# Patient Record
Sex: Female | Born: 1970 | Race: Black or African American | Hispanic: No | Marital: Single | Smoking: Never smoker
Health system: Southern US, Community
[De-identification: ages and names within clinical notes are randomized; demographics above are authoritative.]

## PROBLEM LIST (undated history)

## (undated) HISTORY — PX: OTHER SURGICAL HISTORY: SHX169

---

## 1998-02-28 ENCOUNTER — Emergency Department (HOSPITAL_COMMUNITY): Admission: EM | Admit: 1998-02-28 | Discharge: 1998-02-28 | Payer: Self-pay | Admitting: Emergency Medicine

## 1999-01-12 ENCOUNTER — Encounter: Payer: Self-pay | Admitting: Emergency Medicine

## 1999-01-12 ENCOUNTER — Emergency Department (HOSPITAL_COMMUNITY): Admission: EM | Admit: 1999-01-12 | Discharge: 1999-01-12 | Payer: Self-pay | Admitting: Emergency Medicine

## 2001-01-30 ENCOUNTER — Other Ambulatory Visit: Admission: RE | Admit: 2001-01-30 | Discharge: 2001-01-30 | Payer: Self-pay | Admitting: Obstetrics and Gynecology

## 2001-02-06 ENCOUNTER — Encounter: Payer: Self-pay | Admitting: Obstetrics and Gynecology

## 2001-02-06 ENCOUNTER — Ambulatory Visit (HOSPITAL_COMMUNITY): Admission: RE | Admit: 2001-02-06 | Discharge: 2001-02-06 | Payer: Self-pay | Admitting: Obstetrics and Gynecology

## 2001-09-10 ENCOUNTER — Other Ambulatory Visit: Admission: RE | Admit: 2001-09-10 | Discharge: 2001-09-10 | Payer: Self-pay | Admitting: Family Medicine

## 2002-07-13 ENCOUNTER — Encounter: Payer: Self-pay | Admitting: Obstetrics and Gynecology

## 2002-07-13 ENCOUNTER — Ambulatory Visit (HOSPITAL_COMMUNITY): Admission: RE | Admit: 2002-07-13 | Discharge: 2002-07-13 | Payer: Self-pay | Admitting: Obstetrics and Gynecology

## 2002-07-23 ENCOUNTER — Other Ambulatory Visit: Admission: RE | Admit: 2002-07-23 | Discharge: 2002-07-23 | Payer: Self-pay | Admitting: Obstetrics and Gynecology

## 2002-08-31 ENCOUNTER — Ambulatory Visit (HOSPITAL_COMMUNITY): Admission: RE | Admit: 2002-08-31 | Discharge: 2002-08-31 | Payer: Self-pay | Admitting: Obstetrics and Gynecology

## 2002-08-31 ENCOUNTER — Encounter: Payer: Self-pay | Admitting: Obstetrics and Gynecology

## 2002-09-20 ENCOUNTER — Inpatient Hospital Stay (HOSPITAL_COMMUNITY): Admission: AD | Admit: 2002-09-20 | Discharge: 2002-09-20 | Payer: Self-pay | Admitting: Obstetrics and Gynecology

## 2002-09-28 ENCOUNTER — Ambulatory Visit (HOSPITAL_COMMUNITY): Admission: RE | Admit: 2002-09-28 | Discharge: 2002-09-28 | Payer: Self-pay | Admitting: Obstetrics and Gynecology

## 2002-09-28 ENCOUNTER — Encounter: Payer: Self-pay | Admitting: Obstetrics and Gynecology

## 2002-10-02 ENCOUNTER — Inpatient Hospital Stay (HOSPITAL_COMMUNITY): Admission: AD | Admit: 2002-10-02 | Discharge: 2002-10-02 | Payer: Self-pay | Admitting: Obstetrics and Gynecology

## 2002-10-27 ENCOUNTER — Encounter: Payer: Self-pay | Admitting: Obstetrics and Gynecology

## 2002-10-27 ENCOUNTER — Inpatient Hospital Stay (HOSPITAL_COMMUNITY): Admission: AD | Admit: 2002-10-27 | Discharge: 2002-10-27 | Payer: Self-pay | Admitting: Obstetrics and Gynecology

## 2002-11-06 ENCOUNTER — Encounter: Payer: Self-pay | Admitting: Obstetrics and Gynecology

## 2002-11-06 ENCOUNTER — Inpatient Hospital Stay (HOSPITAL_COMMUNITY): Admission: AD | Admit: 2002-11-06 | Discharge: 2002-11-10 | Payer: Self-pay | Admitting: Obstetrics and Gynecology

## 2002-11-06 ENCOUNTER — Encounter (INDEPENDENT_AMBULATORY_CARE_PROVIDER_SITE_OTHER): Payer: Self-pay

## 2003-06-05 ENCOUNTER — Emergency Department (HOSPITAL_COMMUNITY): Admission: EM | Admit: 2003-06-05 | Discharge: 2003-06-06 | Payer: Self-pay

## 2003-12-16 ENCOUNTER — Emergency Department (HOSPITAL_COMMUNITY): Admission: EM | Admit: 2003-12-16 | Discharge: 2003-12-16 | Payer: Self-pay | Admitting: Family Medicine

## 2006-04-10 ENCOUNTER — Emergency Department (HOSPITAL_COMMUNITY): Admission: EM | Admit: 2006-04-10 | Discharge: 2006-04-10 | Payer: Self-pay | Admitting: Emergency Medicine

## 2006-09-21 ENCOUNTER — Inpatient Hospital Stay (HOSPITAL_COMMUNITY): Admission: AD | Admit: 2006-09-21 | Discharge: 2006-09-21 | Payer: Self-pay | Admitting: Obstetrics & Gynecology

## 2006-09-23 ENCOUNTER — Inpatient Hospital Stay (HOSPITAL_COMMUNITY): Admission: AD | Admit: 2006-09-23 | Discharge: 2006-09-23 | Payer: Self-pay | Admitting: Obstetrics and Gynecology

## 2008-01-25 ENCOUNTER — Inpatient Hospital Stay (HOSPITAL_COMMUNITY): Admission: AD | Admit: 2008-01-25 | Discharge: 2008-01-25 | Payer: Self-pay | Admitting: Obstetrics and Gynecology

## 2008-03-01 ENCOUNTER — Inpatient Hospital Stay (HOSPITAL_COMMUNITY): Admission: AD | Admit: 2008-03-01 | Discharge: 2008-03-01 | Payer: Self-pay | Admitting: Obstetrics and Gynecology

## 2008-05-13 ENCOUNTER — Observation Stay (HOSPITAL_COMMUNITY): Admission: AD | Admit: 2008-05-13 | Discharge: 2008-05-14 | Payer: Self-pay | Admitting: Obstetrics & Gynecology

## 2008-07-11 ENCOUNTER — Inpatient Hospital Stay (HOSPITAL_COMMUNITY): Admission: AD | Admit: 2008-07-11 | Discharge: 2008-07-11 | Payer: Self-pay | Admitting: Obstetrics and Gynecology

## 2008-07-15 ENCOUNTER — Inpatient Hospital Stay (HOSPITAL_COMMUNITY): Admission: AD | Admit: 2008-07-15 | Discharge: 2008-07-19 | Payer: Self-pay | Admitting: Obstetrics and Gynecology

## 2008-07-16 ENCOUNTER — Encounter (INDEPENDENT_AMBULATORY_CARE_PROVIDER_SITE_OTHER): Payer: Self-pay | Admitting: Obstetrics and Gynecology

## 2009-12-05 ENCOUNTER — Emergency Department (HOSPITAL_COMMUNITY)
Admission: EM | Admit: 2009-12-05 | Discharge: 2009-12-05 | Payer: Self-pay | Source: Home / Self Care | Admitting: Pediatric Emergency Medicine

## 2010-01-12 ENCOUNTER — Emergency Department (HOSPITAL_COMMUNITY): Admission: EM | Admit: 2010-01-12 | Discharge: 2010-01-12 | Payer: Self-pay | Admitting: Emergency Medicine

## 2010-05-02 ENCOUNTER — Emergency Department (HOSPITAL_COMMUNITY): Admission: EM | Admit: 2010-05-02 | Discharge: 2010-05-02 | Payer: Self-pay | Admitting: Family Medicine

## 2010-10-22 ENCOUNTER — Encounter: Payer: Self-pay | Admitting: Obstetrics and Gynecology

## 2010-12-15 LAB — POCT PREGNANCY, URINE: Preg Test, Ur: NEGATIVE

## 2010-12-25 LAB — URINALYSIS, ROUTINE W REFLEX MICROSCOPIC
Hgb urine dipstick: NEGATIVE
Ketones, ur: NEGATIVE mg/dL
Protein, ur: NEGATIVE mg/dL
Specific Gravity, Urine: 1.013 (ref 1.005–1.030)

## 2011-02-13 NOTE — Discharge Summary (Signed)
NAMEBROOKLYN, ALFREDO NO.:  [id number]   MEDICAL RECORD NO.:  [id number]          PATIENT TYPE:  INP   LOCATION:  9309                          FACILITY:  [facility]   PHYSICIAN:  Malachi Pro. Ambrose Mantle, M.D. DATE OF BIRTH:  1970/12/11   DATE OF ADMISSION:  07/15/2008  DATE OF DISCHARGE:  07/19/2008                               DISCHARGE SUMMARY   This is a 40 year old black female para 2-1-3-4, gravida 7 with EDC on  July 29, 2008,  admitted for repeat C-section in early labor.  Blood  group and type A positive, negative antibody sickle cell AS, RPR  nonreactive, rubella immune, hepatitis B surface antigen negative, HIV  negative, GC and chlamydia negative, quad screen negative.  This patient  had been scheduled for repeat C-section at 39 weeks; however, she went  into labor, came to the hospital, was observed, had cervical change, and  proceeded to the operating room for low transverse cervical C-section.  The patient underwent that procedure.  She declined to go ahead with a  tubal ligation.  Procedure was done under spinal anesthesia.  Blood loss  about 1000 mL.  Delivery of a female infant 8 pounds 9 ounces, Apgars of 9  at 1 and 9 at 5 minutes.  Postoperatively, the patient did well and was  discharged on the third postop day.  Her staples removed.  Strips were  applied.  She was ambulating well without difficulty, voiding well,  tolerating a regular diet.   Laboratory data showed initial hemoglobin of 12.1, hematocrit 36.1,  white count 10,400, platelet count 195,000.  RPR was nonreactive.  Followup hemoglobin 10, hematocrit 29.8.   FINAL DIAGNOSIS:  Intrauterine pregnancy 38 plus weeks delivered by  cesarean section in active labor after previous cesarean section and  declined  vaginal birth after cesarean.   OPERATION:  Low transverse cervical cesarean section.   FINAL CONDITION:  Improved.   INSTRUCTIONS:  Our regular discharge instruction booklet.   Percocet  5/325, #36 tablets 1 every 4-6 hours as needed for pain, Motrin 600 mg  #30 tablets 1 every 6 hours as needed for pain, and Micronor 1 by mouth  every day.  The patient is advised to return in 10-14 days for followup  examination.      Malachi Pro. Ambrose Mantle, M.D.  Electronically Signed     TFH/MEDQ  D:  07/19/2008  T:  07/19/2008  Job:  [id number]

## 2011-02-13 NOTE — H&P (Signed)
Meghan Pierce, Meghan Pierce NO.:  [id number]   MEDICAL RECORD NO.:  [id number]          PATIENT TYPE:  INP   LOCATION:  9199                          [facility]ACILITY:  [facility]   PHYSICIAN:  Malachi Pro. Ambrose Mantle, M.D. DATE OF BIRTH:  02/04/1971   DATE OF ADMISSION:  07/16/2008  DATE OF DISCHARGE:                              HISTORY & PHYSICAL   PRESENT ILLNESS:  This is a 40 year old black female para 2-1-3-4,  gravida 7 with prior C-section, who declines birth after cesarean, and  is admitted with regular contractions and cervical change.  This patient  began prenatal care late.  She was first seen in our office on March 16, 2008.  An ultrasound on March 04, 2008 showed an average gestational age  of [redacted] weeks with an [facility] of July 29, 2008.  The patient requested  repeat cesarean section and possible tubal ligation.  She had an  uncomplicated prenatal course.  Her blood group and type was A+,  negative antibody, sickle cell test was AS, RPR nonreactive, rubella  immune, hepatitis B surface antigen negative, HIV negative, GC and  chlamydia negative, quad screen negative. The patient was scheduled for  amniocentesis next week to confirm maturity before proceeding with  elective repeat C-section.  However, tonight she began contracting  regularly and painfully, and came to [facility] maternity admission  unit where she was found by the nurse to be 1 cm dilated and after 2-3  hours of observation she actually changed her cervix to 2-3 cm, although  it remains somewhat long and the presenting part is high.  I do feel  like the patient is in labor, and is therefore admitted for repeat C-  section.   No medication allergies.  She does state that she is sensitive to LATEX.   OPERATIONS:  C-section in 2004 for 33-5/7 twins who weighed 5 pounds 2  ounces and 4 pounds 11 ounces.   ILLNESSES:  None.   ALCOHOL, TOBACCO, AND DRUGS:  None.   FAMILY HISTORY:  Maternal grandfather with  high blood pressure, heart  disease, and diabetes.  Maternal grandmother with thyroid problems, and  her mother has thyroid problems.   OBSTETRICAL HISTORY:  In June of 1989, the patient delivered an 8 pound  5 ounce female infant at full term vaginally without complications.  In  November of 1992, she had a 9 pound, 11 ounce female vaginally. In 1999  and 2000, she had early abortions.  In February of 2004 she delivered  female twins 5 pounds 2 ounces and 4 pounds 11 ounces at 33-5/7 weeks  after preterm premature rupture of the membranes.  In 2009 she had a  spontaneous abortion.   PHYSICAL EXAMINATION ON ADMISSION:  Blood pressure is 109/64,  temperature 98.4, pulse 65, respirations 20.  HEAD, EYES, EARS, NOSE, AND THROAT:  Normal.  LUNGS:  Clear to auscultation.  HEART:  Normal size and sounds, no murmurs.  ABDOMEN:  Soft.  The last fundal height on September the 8th that we  have was 34 cm. However, it has significantly changed since then.  Fetal  heart tones are normal.  Contractions are 2-4 minutes apart.  The cervix  is 2-3 cm, probably 40-50% effaced, and vertex is at a -3 station.   ADMITTING IMPRESSION:  Intrauterine pregnancy at approximately 38+  weeks, prior C-section, early labor.  The patient requests repeat C-  section, and I do feel like she is in labor so we will proceed with the  C-section.  She has signed her tubal ligation papers, but she is  uncertain whether she wants me to do it so I will ask her again prior to  surgery.      Malachi Pro. Ambrose Mantle, M.D.  Electronically Signed     TFH/MEDQ  D:  07/16/2008  T:  07/16/2008  Job:  [id number]
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
## 2011-02-13 NOTE — Op Note (Signed)
NAMELINDEY, RENZULLI NO.:  [id number]   MEDICAL RECORD NO.:  [id number]           PATIENT TYPE:   LOCATION:                                 FACILITY:   PHYSICIAN:  Malachi Pro. Ambrose Mantle, M.D. DATE OF BIRTH:  [birth year]   DATE OF PROCEDURE:  07/16/2008  DATE OF DISCHARGE:                               OPERATIVE REPORT   PREOPERATIVE DIAGNOSES:  Intrauterine pregnancy at 38 plus weeks, prior  C-section, declined V back, active labor.   POSTOPERATIVE DIAGNOSES:  Intrauterine pregnancy at 38 plus weeks, prior  C-section, declined V back, active labor.   OPERATION:  Low-transverse cervical C-section.   OPERATOR:  Malachi Pro. Ambrose Mantle, M.D. spinal anesthesia.   The patient had signed sterilization papers, but during her preop  counseling, she elected against having her tubes tied.  She was brought  to the operating room, given a spinal anesthetic by Dr. Malen Gauze placed in  a left lateral tilt position.  Fetal heart tones were normal.  The  abdomen was prepped with Betadine solution.  The urethra was prepped.  The Foley catheter was inserted to straight drain, non latex.  The  abdomen was draped as a sterile field.  The prior incision was somewhat  low and theoretically could sort of fall in the crease of her  panniculus, but I elected to use it anyway, may be incision through the  old one carried in layers down through the skin, subcutaneous tissue,  and fascia.  The fascia was incised transversally and separated from the  rectus muscles superiorly and inferiorly.  The rectus muscle was well  adherent in the midline.  I created a midline and opened the peritoneal  cavity.  There were adhesions present.  The lower uterine segment was  exposed and an incision was then made into the lower uterine segment  transversally through the superficial layers of the myometrium.  I then  entered the amniotic sac with my finger, a large amount of fluid was  obtained, incision was enlarged  by pulling superiorly and inferiorly.  The vertex was then delivered into the incisional opening.  The head was  delivered completely, the nose and pharynx was suctioned with the bulb.  The remainder of the baby was delivered.  The cord was clamped.  The  infant was given to Dr. Fayrene Fearing L. Alison Murray, who was in attendance; who was  an 8 pounds 9 ounces female with Apgars of 9 and 9 at 1 and 5 minutes.  Placenta was removed intact.  The cervix was dilated with a long ring  forceps and the uterine incision was closed with two running sutures of  0-Vicryl, locking the first layer, nonlocking on the second layer.  There was a hematoma that developed to the left uterine angle and I  sutured this with a figure-of-eight suture and it stopped.  It took one  extra figure-of-eight suture to complete hemostasis.  Liberal irrigation  allowed removal of all debris from both gutters.  Incision was checked  again for hemostasis.  There was no bleeding.  The abdominal wall was  then closed  after inspecting the uterus, tubes, and ovaries, which were  normal.  The abdominal wall was closed with interrupted sutures of 0-  Vicryl through the rectus muscle and peritoneum, two running sutures of  0-Vicryl and  the fascia.  A running 3-0 Vicryl on the subcu tissue and staples on the  skin.  The patient seemed to tolerate the procedure well and blood loss  was thought to be about a 1000 mL.  Sponge and needle counts were  correct and she was returned to recovery in satisfactory condition.      Malachi Pro. Ambrose Mantle, M.D.  Electronically Signed     TFH/MEDQ  D:  07/16/2008  T:  07/16/2008  Job:  [id number]

## 2011-02-16 NOTE — Op Note (Signed)
NAME:  Meghan Pierce, Meghan Pierce                        ACCOUNT NO.:  [id number]   MEDICAL RECORD NO.:  [id number]                   PATIENT TYPE:  INP   LOCATION:  9146                                 [facility]ACILITY:  [facility]   PHYSICIAN:  Malachi Pro. Ambrose Mantle, M.D.              DATE OF BIRTH:  06-27-1971   DATE OF PROCEDURE:  11/06/2002  DATE OF DISCHARGE:                                 OPERATIVE REPORT   PREOPERATIVE DIAGNOSES:  1. Intrauterine pregnancy 33 weeks 5 days.  2. Twin gestation.  3. Baby A vertex, baby B transverse.  4. Active labor.  5. Rupture of membranes.   POSTOPERATIVE DIAGNOSES:  1. Intrauterine pregnancy at 33 weeks 5 days.  2. Twin gestation.  3. Baby A vertex, baby B transverse.  4. Active labor.  5. Rupture of membranes.   OPERATION:  Low transverse cervical cesarean section.   OPERATOR:  Malachi Pro. Ambrose Mantle, M.D.   ANESTHESIA:  Spinal.   PROCEDURE:  The patient was brought to the operating room and given a spinal  anesthetic by Quillian Quince, M.D.  She was placed in the left lateral  tilt position.  The abdomen was prepped with Betadine solution and draped as  a sterile field.  After anesthesia was confirmed a transverse incision was  made and carried in layers through the skin, subcutaneous tissue, and  fascia.  The fascia was separated from the rectus muscles superiorly and  inferiorly.  The rectus muscle was already widely separated in the midline.  The peritoneum was opened vertically.  The lower uterine segment was exposed  and incision was made in the lower uterine segment peritoneum and extended  laterally.  The bladder was pushed inferiorly.  Incision was then made into  the lower uterine segment and clear fluid was obtained.  Incision was  extended laterally and upward on both sides.  The vertex was elevated into  the operative field and the vertex was delivered without difficulty.  The  nose and pharynx were suctioned.  Remainder of the baby was delivered.   The  cord was clamped and the infant was given to Barnwell D. Imm, M.D. who was  in attendance.  At this point baby Bs membranes were intact, identified the  baby.  The vertex was in the maternal right upper abdomen.  The feet were in  the left side of the upper abdomen.  Found the left foot and then ruptured  the membranes.  Found the other foot and with fundal pressure on the vertex  pulled the feet down and then after the umbilicus delivered I delivered the  baby up to the chest.  Delivered the right arm first and then the left arm  and with the Mauriceau-Smellie-Deit maneuver delivered the vertex.  The nose  and pharynx were suctioned.  The infant was again given to [place] D. Imm,  M.D. who was in attendance.  He assigned baby  A Apgars of 7 at one and 9 at  five minutes.  It was a female weighing 5 pounds 2 ounces and baby B a  female 4 pounds 11 ounces, Apgars of 7 at one and 9 at five minutes.  The  placenta was removed.  I did not study it carefully to see if it was a  single placenta.  I inspected the inside of the uterus and found it to be  free of any placental fragments.  The cervix was already dilated.  A segment  of cord had been preserved from each baby in case a pH was necessary by  Adelene Amas. Imm, M.D. did not feel a pH was necessary.  I also obtained cord  blood from both babies for routine cord blood studies.  I closed the uterus  in two layers using a running locked suture of 0 Vicryl in the first layer,  nonlocking suture of the same material on the second layer.  I liberally  irrigated the uterine incision in both gutters, removed all clots and  debris, confirmed hemostasis, and checked both tubes and ovaries in the  uterus all of which appeared normal.  Neither the visceral nor the parietal  peritoneum were sutured.  The rectus muscle was closed with interrupted  sutures of 0 Vicryl.  The fascia with two running sutures of 0 Vicryl.  Subcutaneous with a running 3-0  Vicryl.  The skin was closed with automatic  staples.  The patient seemed to tolerate the procedure well.  Blood loss was  about 1000 mL.  Sponge and needle counts were correct.  She was returned to  recovery in satisfactory condition.                                               Malachi Pro. Ambrose Mantle, M.D.    TFH/MEDQ  D:  11/06/2002  T:  11/06/2002  Job:  [id number]

## 2011-02-16 NOTE — Discharge Summary (Signed)
NAME:  Meghan Pierce, Meghan Pierce                        ACCOUNT NO.:  [id number]   MEDICAL RECORD NO.:  [id number]                   PATIENT TYPE:  INP   LOCATION:  9146                                 [facility]ACILITY:  [facility]   PHYSICIAN:  Malachi Pro. Ambrose Mantle, M.D.              DATE OF BIRTH:  02/26/1971   DATE OF ADMISSION:  11/06/2002  DATE OF DISCHARGE:  11/10/2002                                 DISCHARGE SUMMARY   HISTORY OF PRESENT ILLNESS:  The patient is a 40 year old black single  female, para 3-0-2-3, gravida 6, with EDC 12/23/02 by ultrasound on 07/13/02  at 17-3/7 days, admitted with premature rupture of membranes and active  labor with baby A vertex, baby B transverse.   PRENATAL LABORATORY DATA:  Blood group and type A positive, negative  antibody sickle cell, hemoglobin AS, RPR nonreactive, rubella immune.  Hepatitis B surface antigen negative.  GC and Chlamydia negative.  Cystic  fibrosis negative.  Triple screen normal.  One hour Glucola 69.   HOSPITAL COURSE:  The patient's history and physical have been dictated.  In  summary, the patient was admitted with rupture of membranes.  Because of the  second baby being transverse, I elected to proceed with cesarean section.  She underwent a low transverse cervical cesarean section by Dr. Ambrose Mantle under  spinal anesthesia with delivery of baby A female, 5 pounds 2 ounces, Apgar's  of 7 at one at 9 at five minutes, baby B female, 4 pounds 11 ounces, Apgar's  of 7 at one and 9 at five minutes.  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  Postoperatively, the patient did  very well, and was discharged on the fourth postoperative day.  Staples were  out, strips applied.  Initial hemoglobin was 11.7, white count 9700,  platelet count 215,000.  Followup hemoglobin 11.6, hematocrit 34.6.  RPR was  non-reactive.  Ultrasound showed twin presentation, baby A was vertex, baby  B was transverse.   FINAL DIAGNOSES:  1.  Intrauterine pregnancy at 33-5/7 weeks.  2. Twin gestation.  3. Baby A vertex, baby B transverse.  4. Active labor.  5. Rupture of membranes.   OPERATION:  Low transverse cervical cesarean section.   SURGEON:  Malachi Pro. Ambrose Mantle, M.D.   CONDITION ON DISCHARGE:  Improved.   DISCHARGE INSTRUCTIONS:  Include our regular discharge instruction booklet.    DISCHARGE MEDICATIONS:  The patient is given a prescription for Percocet  5/325 mg #16 tablets one q.4-6h. p.r.n. pain.   FOLLOWUP:  The patient is advised to return in 10 to 14 days for followup  examination.                                               Malachi Pro.  Ambrose Mantle, M.D.    TFH/MEDQ  D:  11/10/2002  T:  11/10/2002  Job:  [id number]

## 2011-02-16 NOTE — H&P (Signed)
NAME:  Meghan Pierce, Meghan Pierce NO.:  [id number]   MEDICAL RECORD NO.:  [id number]                   PATIENT TYPE:   LOCATION:                                       FACILITY:   PHYSICIAN:  Malachi Pro. Ambrose Mantle, M.D.              DATE OF BIRTH:   DATE OF ADMISSION:  DATE OF DISCHARGE:                                HISTORY & PHYSICAL   HISTORY OF PRESENT ILLNESS:  The patient is a 40 year old black single  female, Para III, 0/2/3, Gravida VI with last menstrual period July 2003.  Estimated date of confinement December 22, 2002 by ultrasound on July 13, 2002 at 17 weeks and 1 day gestation. Admitted with premature rupture of the  membranes and in active labor with baby A vertex and baby B transverse.  Blood group and type A+ with a negative antibody. Sickle cell, hemoglobin  AS, RPR non-reactive. Rubella immune. Hepatitis B surface antigen negative.  GC and Chlamydia negative. Cystic fibrosis negative. Triple screen normal.  One hour Glucola 69. Prenatal course was complicated by twin gestation with  concordant growth. The patient had spontaneous rupture of membranes on the  morning of admission and came to Maternity Admissions Unit for evaluation.  Rupture of membranes was confirmed by Nitrazine and FERN testing. Ultrasound  showed twins, vertex, transverse. Contractions were every 2-3 minutes. The  cervix was 5 cm, 80%, vertex presentation. I asked for a Cesarean section  but a room was not available. A second team was called and we will proceed  with a Cesarean section when the room is available.   ALLERGIES:  No known drug allergies.   PAST SURGICAL HISTORY:  None.   PAST MEDICAL HISTORY:  Chlamydia and gonorrhea several years ago.   FAMILY HISTORY:  Mother with thyroid problem. Maternal grandfather with high  blood pressure, heart disease, and diabetes mellitus.   SOCIAL HISTORY:  No alcohol, tobacco, and drugs.   OBSTETRIC HISTORY:  In June of 1989,  an 8 pound and 5 ounce female was  delivered vaginally. November of 1992, a 9 pound and 11 ounce female delivered  vaginally. In 1999 and 2000 early abortions. In September of 2002 a 9 pound  4 ounce female delivered vaginally.   PHYSICAL EXAMINATION:  GENERAL: A well developed, well nourished black  female in some distress from the uterine contractions.  VITAL SIGNS: Temperature 98.1, blood pressure 31/62, pulse 83, respiratory  rate 18.  HEENT: No cranial abnormalities. Extraocular muscles intact. Nose and  pharynx clear.  NECK: Supple without thyromegaly.  HEART: Normal size and sounds. No murmurs.  LUNGS: Clear to auscultation and percussion.  BREAST: Not examined.  ABDOMEN: Soft. Fundal height was 41 cm on November 05, 2002. Fetal heart  tones normal for A&B. Cervix 5 cm, 80% vertex at a minus 1.    ADMISSION IMPRESSION:  Intrauterine pregnancy at 33 weeks and 5 days with  twin gestation, vertex, transverse. Active labor, rupture of membranes. The  patient is prepared for Cesarean section. We will proceed with the OR in  available.                                               Malachi Pro. Ambrose Mantle, M.D.    TFH/MEDQ  D:  11/06/2002  T:  11/06/2002  Job:  [id number]

## 2011-06-26 LAB — URINALYSIS, ROUTINE W REFLEX MICROSCOPIC
Bilirubin Urine: NEGATIVE
Hgb urine dipstick: NEGATIVE
Nitrite: NEGATIVE
Protein, ur: NEGATIVE

## 2011-06-26 LAB — CBC
HCT: 33.9 — ABNORMAL LOW
Hemoglobin: 11.8 — ABNORMAL LOW
MCHC: 34.7
MCV: 91.3
RBC: 3.72 — ABNORMAL LOW

## 2011-06-26 LAB — POCT PREGNANCY, URINE: Preg Test, Ur: POSITIVE

## 2011-07-02 LAB — CBC
HCT: 29.8 — ABNORMAL LOW
Hemoglobin: 10 — ABNORMAL LOW
MCV: 94.9
Platelets: 195
RBC: 3.8 — ABNORMAL LOW
RDW: 14
WBC: 10.4
WBC: 12.1 — ABNORMAL HIGH

## 2011-12-06 ENCOUNTER — Emergency Department (HOSPITAL_COMMUNITY)
Admission: EM | Admit: 2011-12-06 | Discharge: 2011-12-06 | Disposition: A | Discharge status: Home / Self Care | Payer: Self-pay | Attending: Emergency Medicine | Admitting: Emergency Medicine

## 2011-12-06 ENCOUNTER — Encounter (HOSPITAL_COMMUNITY): Payer: Self-pay

## 2011-12-06 DIAGNOSIS — L03119 Cellulitis of unspecified part of limb: Secondary | ICD-10-CM

## 2011-12-06 DIAGNOSIS — R21 Rash and other nonspecific skin eruption: Secondary | ICD-10-CM | POA: Insufficient documentation

## 2011-12-06 DIAGNOSIS — IMO0002 Reserved for concepts with insufficient information to code with codable children: Secondary | ICD-10-CM | POA: Insufficient documentation

## 2011-12-06 MED ORDER — HYDROCODONE-ACETAMINOPHEN 5-325 MG PO TABS: 1.0000 | ORAL_TABLET | ORAL | Status: AC | PRN

## 2011-12-06 MED ORDER — CEPHALEXIN 250 MG PO CAPS
500.0000 mg | ORAL_CAPSULE | Freq: Once | ORAL | Status: AC
  Administered 2011-12-06: 500 mg via ORAL
  Filled 2011-12-06: qty 2

## 2011-12-06 MED ORDER — CEPHALEXIN 500 MG PO CAPS: 500.0000 mg | ORAL_CAPSULE | Freq: Four times a day (QID) | ORAL | Status: AC

## 2011-12-06 NOTE — Discharge Instructions (Signed)
Cellulitis Cellulitis is an infection of the skin and the tissue beneath it. The area is typically red and tender. It is caused by germs (bacteria) (usually staph or strep) that enter the body through cuts or sores. Cellulitis most commonly occurs in the arms or lower legs.  HOME CARE INSTRUCTIONS   If you are given a prescription for medications which kill germs (antibiotics), take as directed until finished.   If the infection is on the arm or leg, keep the limb elevated as able.   Use a warm cloth several times per day to relieve pain and encourage healing.   See your caregiver for recheck of the infected site as directed if problems arise.   Only take over-the-counter or prescription medicines for pain, discomfort, or fever as directed by your caregiver.  SEEK MEDICAL CARE IF:   The area of redness (inflammation) is spreading, there are red streaks coming from the infected site, or if a part of the infection begins to turn dark in color.   The joint or bone underneath the infected skin becomes painful after the skin has healed.   The infection returns in the same or another area after it seems to have gone away.   A boil or bump swells up. This may be an abscess.   New, unexplained problems such as pain or fever develop.  SEEK IMMEDIATE MEDICAL CARE IF:   You have a fever.   You or your child feels drowsy or lethargic.   There is vomiting, diarrhea, or lasting discomfort or feeling ill (malaise) with muscle aches and pains.  MAKE SURE YOU:   Understand these instructions.   Will watch your condition.   Will get help right away if you are not doing well or get worse.  Document Released: 06/27/2005 Document Revised: 09/06/2011 Document Reviewed: 05/05/2008 [facility] Patient Information 2012 [place], [place].  Monitor the marked area on your arm.  If the redness and swelling continues to increase despite medication, return to the ED or urgent care center for reevaluation.

## 2011-12-06 NOTE — ED Provider Notes (Signed)
History     CSN: [id number]  Arrival date & time 12/06/11  1807   First MD Initiated Contact with Patient 12/06/11 1832      Chief Complaint  Patient presents with  . Joint Swelling    right arm swelling    (Consider location/radiation/quality/duration/timing/severity/associated sxs/prior treatment) Patient is a 41 y.o. female presenting with rash.  Rash  The current episode started 12 to 24 hours ago. The problem has been gradually worsening. The problem is associated with an unknown factor. There has been no fever. The rash is present on the right arm. The pain is moderate. The pain has been fluctuating since onset. Associated symptoms include itching and pain.    History reviewed. No pertinent past medical history.  History reviewed. No pertinent past surgical history.  History reviewed. No pertinent family history.  History  Substance Use Topics  . Smoking status: Never Smoker   . Smokeless tobacco: Not on file  . Alcohol Use: No    OB History    Grav Para Term Preterm Abortions TAB SAB Ect Mult Living                  Review of Systems  Skin: Positive for itching and rash.  All other systems reviewed and are negative.    Allergies  Review of patient's allergies indicates no known allergies.  Home Medications   Current Outpatient Rx  Name Route Sig Dispense Refill  . XANAX PO Oral Take 1 tablet by mouth at bedtime as needed. For sleep      BP 106/72  Pulse 80  Temp(Src) 98.1 F (36.7 C) (Oral)  Resp 16  SpO2 98%  LMP 11/25/2011  Physical Exam  Nursing note and vitals reviewed. Constitutional: She is oriented to person, place, and time. She appears well-developed and well-nourished.  HENT:  Head: Normocephalic.  Eyes: Conjunctivae are normal. Pupils are equal, round, and reactive to light.  Neck: Normal range of motion. Neck supple.  Cardiovascular: Normal rate, regular rhythm, normal heart sounds and intact distal pulses.   Pulmonary/Chest:  Effort normal and breath sounds normal.  Abdominal: Soft. Bowel sounds are normal.  Musculoskeletal: Normal range of motion.  Neurological: She is alert and oriented to person, place, and time.  Skin: Skin is warm and dry. There is erythema.     Psychiatric: She has a normal mood and affect. Her behavior is normal. Judgment and thought content normal.    ED Course  Procedures (including critical care time)  Labs Reviewed - No data to display No results found.   1. Cellulitis of upper arm      Area of cellulitis marked.  Patient started on Keflex.  Patient to return if symptoms worsen or do not improve despite treatment over the next 24-48 hours. MDM          Jimmye Norman, NP 12/06/11 1853  Jimmye Norman, NP 12/06/11 Windell Moment

## 2011-12-06 NOTE — ED Notes (Signed)
Pt states that she woke up with right upper arm redness, swelling, and pain this morning. She is unsure if anything bit her. She states that the pain is throbbing in nature. She took otc tylenol with no relief.

## 2011-12-07 NOTE — ED Provider Notes (Signed)
Medical screening examination/treatment/procedure(s) were performed by non-physician practitioner and as supervising physician I was immediately available for consultation/collaboration.  Toy Baker, MD 12/07/11 [phone]

## 2012-02-15 ENCOUNTER — Encounter (HOSPITAL_COMMUNITY): Payer: Self-pay

## 2012-02-15 ENCOUNTER — Emergency Department (HOSPITAL_COMMUNITY)
Admission: EM | Admit: 2012-02-15 | Discharge: 2012-02-15 | Disposition: A | Discharge status: Home / Self Care | Payer: Self-pay | Attending: Emergency Medicine | Admitting: Emergency Medicine

## 2012-02-15 DIAGNOSIS — M7989 Other specified soft tissue disorders: Secondary | ICD-10-CM | POA: Insufficient documentation

## 2012-02-15 DIAGNOSIS — M79609 Pain in unspecified limb: Secondary | ICD-10-CM | POA: Insufficient documentation

## 2012-02-15 DIAGNOSIS — L0390 Cellulitis, unspecified: Secondary | ICD-10-CM

## 2012-02-15 DIAGNOSIS — L03039 Cellulitis of unspecified toe: Secondary | ICD-10-CM | POA: Insufficient documentation

## 2012-02-15 DIAGNOSIS — L02619 Cutaneous abscess of unspecified foot: Secondary | ICD-10-CM | POA: Insufficient documentation

## 2012-02-15 MED ORDER — HYDROCODONE-ACETAMINOPHEN 5-325 MG PO TABS
1.0000 | ORAL_TABLET | Freq: Once | ORAL | Status: AC
  Administered 2012-02-15: 1 via ORAL
  Filled 2012-02-15: qty 1

## 2012-02-15 MED ORDER — CLINDAMYCIN HCL 150 MG PO CAPS: 450.0000 mg | ORAL_CAPSULE | Freq: Three times a day (TID) | ORAL | Status: AC

## 2012-02-15 NOTE — Discharge Instructions (Signed)
Cellulitis Cellulitis is an infection of the tissue under the skin. The infected area is usually red and tender. This is caused by germs. These germs enter the body through cuts or sores. This usually happens in the arms or lower legs. HOME CARE   Take your medicine as told. Finish it even if you start to feel better.   If the infection is on the arm or leg, keep it raised (elevated).   Use a warm cloth on the infected area several times a day.   See your doctor for a follow-up visit as told.  GET HELP RIGHT AWAY IF:   You are tired or confused.   You throw up (vomit).   You have watery poop (diarrhea).   You feel ill and have muscle aches.   You have a fever.  MAKE SURE YOU:   Understand these instructions.   Will watch your condition.   Will get help right away if you are not doing well or get worse.  Document Released: 03/05/2008 Document Revised: 09/06/2011 Document Reviewed: 08/19/2009 [employer] Patient Information 2012 [employer]. 

## 2012-02-15 NOTE — ED Notes (Signed)
Patient complains of left 2nd toe itching and swelling x 1 day. States that she was bit by an unknown insect after being outside. Erythema and minimal edema noted to toe.  No extension of swelling or redness noted. Sensation intact. No n/t. Full ROM of toes. No deficits noted.

## 2012-02-15 NOTE — ED Notes (Signed)
Patient's cell phone found in stretcher 5 after she had been discharged. Phone was placed in sealed specimen bag with patient label attached. Given to nurse first in anticipation of patient return to retrieve the phone.

## 2012-02-15 NOTE — ED Notes (Signed)
Pt here for bug bite to left 2nd toe 24 hours ago, swelling noted

## 2012-02-15 NOTE — ED Provider Notes (Signed)
History     CSN: [id number]  Arrival date & time 02/15/12  1501   First MD Initiated Contact with Patient 02/15/12 1527      Chief Complaint  Patient presents with  . Insect Bite    (Consider location/radiation/quality/duration/timing/severity/associated sxs/prior treatment) HPI Comments: Patient comes in today after a possible bug bite that may have occurred yesterday.  She states that she did not feel or see anything bite her.  However, she was walking around outside and then noticed when she came inside that she had a small blister to the dorsal aspect of the 2nd digit of the left foot.  She later developed erythema and mild swelling around the area.  The area does not itch, but is painful.  She denies fever or chills.  She reports that she has never had anything like this before.  No prior history of gout.    The history is provided by the patient.    History reviewed. No pertinent past medical history.  History reviewed. No pertinent past surgical history.  History reviewed. No pertinent family history.  History  Substance Use Topics  . Smoking status: Never Smoker   . Smokeless tobacco: Not on file  . Alcohol Use: No    OB History    Grav Para Term Preterm Abortions TAB SAB Ect Mult Living                  Review of Systems  Constitutional: Negative for fever and chills.  Gastrointestinal: Negative for nausea and vomiting.  Musculoskeletal: Negative for joint swelling and gait problem.       Pain with ambulation  Skin: Positive for color change.    Allergies  Tape  Home Medications   Current Outpatient Rx  Name Route Sig Dispense Refill  . OVER THE COUNTER MEDICATION Oral Take 1 tablet by mouth daily. "Herbal Life"      BP 106/63  Pulse 82  Temp(Src) 98.5 F (36.9 C) (Oral)  Resp 20  SpO2 99%  Physical Exam  Nursing note and vitals reviewed. Constitutional: She is oriented to person, place, and time. She appears well-developed and  well-nourished. No distress.  HENT:  Head: Normocephalic and atraumatic.  Mouth/Throat: Oropharynx is clear and moist.  Cardiovascular: Normal rate, regular rhythm and normal heart sounds.   Pulses:      Dorsalis pedis pulses are 2+ on the left side.  Pulmonary/Chest: Effort normal and breath sounds normal.  Musculoskeletal: Normal range of motion.  Neurological: She is alert and oriented to person, place, and time. No sensory deficit. Gait normal.  Skin: Skin is warm, dry and intact. She is not diaphoretic.       Small blister of the dorsal aspect of the left 2nd digit.  Mild erythema of the dorsal aspect of the left foot.  Psychiatric: She has a normal mood and affect.    ED Course  Procedures (including critical care time)  Labs Reviewed - No data to display No results found.   No diagnosis found.    MDM  Patient comes in today with mild erythema and swelling of the dorsal aspect of her foot.  No warmth to the touch.  Full ROM of all of her toes.  Patient afebrile.          Pascal Lux Gilson, PA-C 02/17/12 [phone]

## 2012-02-17 NOTE — ED Provider Notes (Signed)
Medical screening examination/treatment/procedure(s) were performed by non-physician practitioner and as supervising physician I was immediately available for consultation/collaboration.   Gerhard Munch, MD 02/17/12 [phone]

## 2012-11-11 ENCOUNTER — Emergency Department (HOSPITAL_COMMUNITY)
Admission: EM | Admit: 2012-11-11 | Discharge: 2012-11-11 | Disposition: A | Discharge status: Home / Self Care | Payer: 59 | Attending: Emergency Medicine | Admitting: Emergency Medicine

## 2012-11-11 ENCOUNTER — Encounter (HOSPITAL_COMMUNITY): Payer: Self-pay | Admitting: Physical Medicine and Rehabilitation

## 2012-11-11 DIAGNOSIS — Z23 Encounter for immunization: Secondary | ICD-10-CM | POA: Insufficient documentation

## 2012-11-11 DIAGNOSIS — S61209A Unspecified open wound of unspecified finger without damage to nail, initial encounter: Secondary | ICD-10-CM | POA: Insufficient documentation

## 2012-11-11 DIAGNOSIS — Y9389 Activity, other specified: Secondary | ICD-10-CM | POA: Insufficient documentation

## 2012-11-11 DIAGNOSIS — Y92009 Unspecified place in unspecified non-institutional (private) residence as the place of occurrence of the external cause: Secondary | ICD-10-CM | POA: Insufficient documentation

## 2012-11-11 DIAGNOSIS — S61219A Laceration without foreign body of unspecified finger without damage to nail, initial encounter: Secondary | ICD-10-CM

## 2012-11-11 DIAGNOSIS — W2203XA Walked into furniture, initial encounter: Secondary | ICD-10-CM | POA: Insufficient documentation

## 2012-11-11 MED ORDER — TETANUS-DIPHTH-ACELL PERTUSSIS 5-2.5-18.5 LF-MCG/0.5 IM SUSP
0.5000 mL | Freq: Once | INTRAMUSCULAR | Status: AC
  Administered 2012-11-11: 0.5 mL via INTRAMUSCULAR
  Filled 2012-11-11: qty 0.5

## 2012-11-11 MED ORDER — IBUPROFEN 600 MG PO TABS: 600.0000 mg | ORAL_TABLET | Freq: Four times a day (QID) | ORAL | Status: DC | PRN

## 2012-11-11 NOTE — ED Provider Notes (Signed)
History     CSN: [id number]  Arrival date & time 11/11/12  1431   First MD Initiated Contact with Patient 11/11/12 1451      Chief Complaint  Patient presents with  . Extremity Laceration    (Consider location/radiation/quality/duration/timing/severity/associated sxs/prior treatment) HPI Comments: Patient is a 42 y/o female who presents to the ED complaining of a laceration to right index finger.  Patient states that she was moving a heavy bed frame when it slipped out of her hand and cut her right index finger.  Patient states that the finger is a constant throbbing pain. Denies numbness or tingling. Patient does not know when her last tetanus shot was.      The history is provided by the patient. No language interpreter was used.    No past medical history on file.  No past surgical history on file.  No family history on file.  History  Substance Use Topics  . Smoking status: Never Smoker   . Smokeless tobacco: Not on file  . Alcohol Use: No    OB History   Grav Para Term Preterm Abortions TAB SAB Ect Mult Living                  Review of Systems  Constitutional: Negative for fever and chills.  Gastrointestinal: Negative for nausea.  Skin: Positive for wound.  Neurological: Negative for numbness.  All other systems reviewed and are negative.    Allergies  Tape  Home Medications   Current Outpatient Rx  Name  Route  Sig  Dispense  Refill  . OVER THE COUNTER MEDICATION   Oral   Take 1 tablet by mouth daily. "Herbal Life"           There were no vitals taken for this visit.  Physical Exam  Nursing note and vitals reviewed. Constitutional: She is oriented to person, place, and time. She appears well-developed and well-nourished. No distress.  HENT:  Head: Normocephalic and atraumatic.  Eyes: Conjunctivae are normal.  Neck: Normal range of motion. Neck supple.  Cardiovascular: Normal rate, regular rhythm and normal heart sounds.   Capillary  refill to right index finger less than 2 seconds  Pulmonary/Chest: Effort normal and breath sounds normal.  Musculoskeletal: Normal range of motion.  5/5 flexion and extension of the right index finger. No evidence of tendinous disruption.  Neurological: She is alert and oriented to person, place, and time. She has normal strength. No sensory deficit.  Skin: Skin is warm and dry. Laceration noted.  1.5 cm angular laceration to palmar aspect of right index finger.  Bleeding controlled.  Psychiatric: She has a normal mood and affect. Her behavior is normal.    ED Course  Procedures (including critical care time) LACERATION REPAIR Performed by: Johnnette Gourd Authorized by: Johnnette Gourd Consent: Verbal consent obtained. Risks and benefits: risks, benefits and alternatives were discussed Consent given by: patient Patient identity confirmed: provided demographic data Prepped and Draped in normal sterile fashion Wound explored  Laceration Location: right second digit  Laceration Length: 1.5 cm  No Foreign Bodies seen or palpated  Anesthesia: local infiltration  Local anesthetic: lidocaine 2% without epinephrine  Anesthetic total: 2.5 ml  Irrigation method: syringe Amount of cleaning: standard  Skin closure: 5-0 prolene  Number of sutures: 4  Technique: simple interrupted  Patient tolerance: Patient tolerated the procedure well with no immediate complications.  Labs Reviewed - No data to display No results found.   1. Finger laceration  MDM   41 year old female with right finger laceration. 4 sutures placed. Wound care given. Neurovascularly intact. No evidence of tendon disruption. Tdap given. She will followup with urgent care in 7-10 days for suture removal. Return precautions discussed. Patient states understanding of plan and is agreeable.       Trevor Mace, PA-C 11/11/12 1637  Trevor Mace, PA-C 11/11/12 1820

## 2012-11-11 NOTE — ED Notes (Signed)
Pt presents to department for evaluation of R 1st finger laceration. States she cut finger on bedframe helping a friend move. 1inch (L) shaped laceration noted. Bleeding controlled. CMS intact. Tetanus unknown. Pt is alert and oriented x4. NAD.

## 2012-11-11 NOTE — ED Notes (Signed)
Pt reports to the ED for eval of right index finger injury. Pt states she was carrying a heavy headboard and part of it fell on her finger. Index finger noted to have a laceration to the distal portion. Swelling noted but no obvious deformity. CMS intact. Pt can move the affected digit. Bleeding controlled.

## 2012-11-12 NOTE — ED Provider Notes (Signed)
Medical screening examination/treatment/procedure(s) were performed by non-physician practitioner and as supervising physician I was immediately available for consultation/collaboration.   Richardean Canal, MD 11/12/12 2221

## 2013-01-20 ENCOUNTER — Emergency Department (INDEPENDENT_AMBULATORY_CARE_PROVIDER_SITE_OTHER)
Admission: EM | Admit: 2013-01-20 | Discharge: 2013-01-20 | Disposition: A | Discharge status: Home / Self Care | Payer: 59 | Source: Home / Self Care

## 2013-01-20 ENCOUNTER — Encounter (HOSPITAL_COMMUNITY): Payer: Self-pay | Admitting: Emergency Medicine

## 2013-01-20 DIAGNOSIS — J309 Allergic rhinitis, unspecified: Secondary | ICD-10-CM

## 2013-01-20 DIAGNOSIS — R0982 Postnasal drip: Secondary | ICD-10-CM

## 2013-01-20 DIAGNOSIS — S0083XA Contusion of other part of head, initial encounter: Secondary | ICD-10-CM

## 2013-01-20 DIAGNOSIS — J029 Acute pharyngitis, unspecified: Secondary | ICD-10-CM
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
## 2013-01-23 NOTE — ED Provider Notes (Signed)
Medical screening examination/treatment/procedure(s) were performed by resident physician or non-physician practitioner and as supervising physician I was immediately available for consultation/collaboration.   Barkley Bruns MD.   Linna Hoff, MD 01/23/13 2016

## 2013-02-09 ENCOUNTER — Emergency Department (INDEPENDENT_AMBULATORY_CARE_PROVIDER_SITE_OTHER): Payer: 59

## 2013-02-09 ENCOUNTER — Encounter (HOSPITAL_COMMUNITY): Payer: Self-pay | Admitting: Emergency Medicine

## 2013-02-09 ENCOUNTER — Emergency Department (HOSPITAL_COMMUNITY)
Admission: EM | Admit: 2013-02-09 | Discharge: 2013-02-09 | Disposition: A | Discharge status: Home / Self Care | Payer: 59 | Source: Home / Self Care | Attending: Emergency Medicine | Admitting: Emergency Medicine

## 2013-02-09 ENCOUNTER — Emergency Department (HOSPITAL_COMMUNITY): Payer: 59

## 2013-02-09 DIAGNOSIS — S239XXA Sprain of unspecified parts of thorax, initial encounter: Secondary | ICD-10-CM

## 2013-02-09 MED ORDER — IBUPROFEN 800 MG PO TABS
800.0000 mg | ORAL_TABLET | Freq: Once | ORAL | Status: AC
  Administered 2013-02-09: 800 mg via ORAL

## 2013-02-09 MED ORDER — IBUPROFEN 800 MG PO TABS
ORAL_TABLET | ORAL | Status: AC
  Filled 2013-02-09: qty 1

## 2013-02-09 MED ORDER — MELOXICAM 7.5 MG PO TABS: 7.5000 mg | ORAL_TABLET | Freq: Every day | ORAL | Status: AC

## 2013-02-09 MED ORDER — CYCLOBENZAPRINE HCL 10 MG PO TABS: 10.0000 mg | ORAL_TABLET | Freq: Two times a day (BID) | ORAL | Status: AC | PRN

## 2013-02-09 NOTE — ED Provider Notes (Signed)
History     CSN: [id number]  Arrival date & time 02/09/13  1002   First MD Initiated Contact with Patient 02/09/13 1019      Chief Complaint  Patient presents with  . Back Pain    (Consider location/radiation/quality/duration/timing/severity/associated sxs/prior treatment) HPI Comments: Patient presents this morning to urgent care complaining of upper back pain. Describes a Thursday she was at home moving and lifting furniture when she slipped and SLIDE ABOUT 4 STEPS. She feels she injured or fell on top of her upper back. She denies pain in any of her upper extremities including hands and wrist and denies any abrasions or cuts. Denies any head injury. She's been having these pains since a fall and had been taking ibuprofen over-the-counter for it. She denies any other symptoms. 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 She is requesting a doctor's note for the days that she was off it is also requesting if she can be off for 4 days.  Pertinent negatives are numbness, tingling, weakness of any upper or lower extremities. No respiratory symptoms such as cough or shortness of breath.  Patient is a 42 y.o. female presenting with fall. The history is provided by the patient.  Fall The accident occurred more than 2 days ago. The fall occurred while standing. She fell from a height of 1 to 2 ft. She landed on carpet. There was no blood loss. Point of impact: upper back "she thinks"... The pain is at a severity of 6/10. She was ambulatory at the scene. There was no entrapment after the fall. There was no drug use involved in the accident. Pertinent negatives include no fever and no headaches. Treatment on scene includes a backboard. She has tried nothing for the symptoms. The treatment provided no relief.    History reviewed. No pertinent past medical history.  History reviewed. No  pertinent past surgical history.  History reviewed. No pertinent family history.  History  Substance Use Topics  . Smoking status: Never Smoker   . Smokeless tobacco: Not on file  . Alcohol Use: No    OB History   Grav Para Term Preterm Abortions TAB SAB Ect Mult Living                  Review of Systems  Constitutional: Negative for fever, chills, diaphoresis, activity change and appetite change.  Respiratory: Negative for cough, choking, chest tightness and shortness of breath.   Musculoskeletal: Positive for back pain. Negative for myalgias, joint swelling, arthralgias and gait problem.  Skin: Negative for color change, pallor, rash and wound.  Neurological: Negative for facial asymmetry and headaches.    Allergies  Tape  Home Medications   Current Outpatient Rx  Name  Route  Sig  Dispense  Refill  . cyclobenzaprine (FLEXERIL) 10 MG tablet   Oral   Take 1 tablet (10 mg total) by mouth 2 (two) times daily as needed for muscle spasms.   6 tablet   0   . meloxicam (MOBIC) 7.5 MG tablet   Oral   Take 1 tablet (7.5 mg total) by mouth daily.   7 tablet   0     BP 112/71  Pulse 69  Temp(Src) 98.9 F (37.2 C) (Oral)  Resp 16  SpO2 99%  LMP 01/18/2013  Physical Exam  Vitals reviewed. Constitutional: Vital signs are  normal. She appears well-developed and well-nourished.  Non-toxic appearance. She does not have a sickly appearance. She does not appear ill. No distress.  HENT:  Head: Normocephalic and atraumatic.  Eyes: Conjunctivae are normal.  Neck: Neck supple. No JVD present.  Pulmonary/Chest: Effort normal and breath sounds normal. No respiratory distress. She has no decreased breath sounds. She has no wheezes. She has no rhonchi.    Musculoskeletal: She exhibits tenderness.  Neurological: She is alert.  Skin: No rash noted. No erythema.    ED Course  Procedures (including critical care time)  Labs Reviewed - No data to display Dg Thoracic Spine 2  View  02/09/2013  *RADIOLOGY REPORT*  Clinical Data: 42 year old female status post fall, blunt trauma. Pain.  THORACIC SPINE - 2 VIEW  Comparison: None.  Findings: Normal thoracic segmentation. Bone mineralization is within normal limits. Cervicothoracic junction alignment is within normal limits.  Thoracic vertebral height and alignment within normal limits.  Intermittent mild disc space loss and endplate spurring in the thoracic spine.  Posterior ribs appear intact. Visible thoracic visceral contours appear grossly normal.  IMPRESSION: No acute fracture or listhesis identified in the thoracic spine.   Original Report Authenticated By: Erskine Speed, M.D.      1. Thoracic back sprain, initial encounter       MDM  Status post fall- 4 days ago. Unremarkable thoracic spine. No fractures, dislocations or anterolisthesis. Exam was nonfocal and nonspecific. Patient was prescribed a course of meloxicam for 7 days and instructed to take a muscle relaxer for 48 hours. Patient was given a work note for the next 48 hours she can take a muscle relaxer as indicated and to take meloxicam if pain was to persist she was instructed to followup with an orthopedic doctor for further restrictions if necessary. Retractions were provided for 48 hours after her return as in not lifting more than 25 pounds.        Jimmie Molly, MD 02/09/13 1109

## 2013-02-09 NOTE — ED Notes (Signed)
Pt  Reports        Symptoms  Of  Low  Back  Pain          Not  releived  By  otc  meds   As  Well  As  Heat          Pt  Reports  She  Felled   Down   Steps   4  Days  Ago  Pain is  Mid  -  Upper  back

## 2015-03-09 ENCOUNTER — Emergency Department (HOSPITAL_COMMUNITY): Payer: Self-pay

## 2015-03-09 ENCOUNTER — Emergency Department (HOSPITAL_COMMUNITY): Payer: No Typology Code available for payment source

## 2015-03-09 ENCOUNTER — Encounter (HOSPITAL_COMMUNITY): Payer: Self-pay | Admitting: Emergency Medicine

## 2015-03-09 ENCOUNTER — Emergency Department (HOSPITAL_COMMUNITY)
Admission: EM | Admit: 2015-03-09 | Discharge: 2015-03-09 | Disposition: A | Discharge status: Home / Self Care | Payer: Self-pay | Attending: Emergency Medicine | Admitting: Emergency Medicine

## 2015-03-09 DIAGNOSIS — S199XXA Unspecified injury of neck, initial encounter: Secondary | ICD-10-CM | POA: Insufficient documentation

## 2015-03-09 DIAGNOSIS — S79911A Unspecified injury of right hip, initial encounter: Secondary | ICD-10-CM | POA: Insufficient documentation

## 2015-03-09 DIAGNOSIS — S4992XA Unspecified injury of left shoulder and upper arm, initial encounter: Secondary | ICD-10-CM | POA: Insufficient documentation

## 2015-03-09 DIAGNOSIS — Y92410 Unspecified street and highway as the place of occurrence of the external cause: Secondary | ICD-10-CM | POA: Insufficient documentation

## 2015-03-09 DIAGNOSIS — S79912A Unspecified injury of left hip, initial encounter: Secondary | ICD-10-CM | POA: Insufficient documentation

## 2015-03-09 DIAGNOSIS — S8991XA Unspecified injury of right lower leg, initial encounter: Secondary | ICD-10-CM | POA: Insufficient documentation

## 2015-03-09 DIAGNOSIS — S4991XA Unspecified injury of right shoulder and upper arm, initial encounter: Secondary | ICD-10-CM | POA: Insufficient documentation

## 2015-03-09 DIAGNOSIS — S0990XA Unspecified injury of head, initial encounter: Secondary | ICD-10-CM | POA: Insufficient documentation

## 2015-03-09 DIAGNOSIS — Z23 Encounter for immunization: Secondary | ICD-10-CM | POA: Insufficient documentation

## 2015-03-09 DIAGNOSIS — Y998 Other external cause status: Secondary | ICD-10-CM | POA: Insufficient documentation

## 2015-03-09 DIAGNOSIS — S29001A Unspecified injury of muscle and tendon of front wall of thorax, initial encounter: Secondary | ICD-10-CM | POA: Insufficient documentation

## 2015-03-09 DIAGNOSIS — S3992XA Unspecified injury of lower back, initial encounter: Secondary | ICD-10-CM | POA: Insufficient documentation

## 2015-03-09 DIAGNOSIS — S3991XA Unspecified injury of abdomen, initial encounter: Secondary | ICD-10-CM | POA: Insufficient documentation

## 2015-03-09 DIAGNOSIS — Y9389 Activity, other specified: Secondary | ICD-10-CM | POA: Insufficient documentation

## 2015-03-09 DIAGNOSIS — S8992XA Unspecified injury of left lower leg, initial encounter: Secondary | ICD-10-CM | POA: Insufficient documentation

## 2015-03-09 LAB — RAPID URINE DRUG SCREEN, HOSP PERFORMED
AMPHETAMINES: NOT DETECTED
Barbiturates: NOT DETECTED
Benzodiazepines: NOT DETECTED
COCAINE: NOT DETECTED
OPIATES: NOT DETECTED
TETRAHYDROCANNABINOL: NOT DETECTED

## 2015-03-09 LAB — CBC WITH DIFFERENTIAL/PLATELET
BASOS ABS: 0 10*3/uL (ref 0.0–0.1)
BASOS PCT: 0 % (ref 0–1)
Basophils Absolute: 0 10*3/uL (ref 0.0–0.1)
Basophils Relative: 0 % (ref 0–1)
EOS ABS: 0.1 10*3/uL (ref 0.0–0.7)
EOS PCT: 1 % (ref 0–5)
Eosinophils Absolute: 0.1 10*3/uL (ref 0.0–0.7)
Eosinophils Relative: 2 % (ref 0–5)
HCT: 37 % (ref 36.0–46.0)
HCT: 35.2 % — ABNORMAL LOW (ref 36.0–46.0)
HEMOGLOBIN: 12.6 g/dL (ref 12.0–15.0)
HEMOGLOBIN: 12.3 g/dL (ref 12.0–15.0)
LYMPHS ABS: 1 10*3/uL (ref 0.7–4.0)
Lymphocytes Relative: 26 % (ref 12–46)
Lymphocytes Relative: 13 % (ref 12–46)
Lymphs Abs: 1.5 10*3/uL (ref 0.7–4.0)
MCH: 30.1 pg (ref 26.0–34.0)
MCH: 30.9 pg (ref 26.0–34.0)
MCHC: 34.1 g/dL (ref 30.0–36.0)
MCHC: 34.9 g/dL (ref 30.0–36.0)
MCV: 88.5 fL (ref 78.0–100.0)
MCV: 88.4 fL (ref 78.0–100.0)
MONO ABS: 0.4 10*3/uL (ref 0.1–1.0)
Monocytes Absolute: 0.5 10*3/uL (ref 0.1–1.0)
Monocytes Relative: 8 % (ref 3–12)
Monocytes Relative: 6 % (ref 3–12)
NEUTROS ABS: 3.7 10*3/uL (ref 1.7–7.7)
Neutro Abs: 6.5 10*3/uL (ref 1.7–7.7)
Neutrophils Relative %: 64 % (ref 43–77)
Neutrophils Relative %: 80 % — ABNORMAL HIGH (ref 43–77)
Platelets: 233 10*3/uL (ref 150–400)
Platelets: 220 10*3/uL (ref 150–400)
RBC: 4.18 MIL/uL (ref 3.87–5.11)
RBC: 3.98 MIL/uL (ref 3.87–5.11)
RDW: 13.1 % (ref 11.5–15.5)
RDW: 13.1 % (ref 11.5–15.5)
WBC: 5.8 10*3/uL (ref 4.0–10.5)
WBC: 8.1 10*3/uL (ref 4.0–10.5)

## 2015-03-09 LAB — TROPONIN I: Troponin I: <0.03 ng/mL (ref <0.031)

## 2015-03-09 LAB — COMPREHENSIVE METABOLIC PANEL
ALT: 29 U/L (ref 14–54)
ANION GAP: 10 (ref 5–15)
AST: 39 U/L (ref 15–41)
Albumin: 3.9 g/dL (ref 3.5–5.0)
Alkaline Phosphatase: 34 U/L — ABNORMAL LOW (ref 38–126)
BUN: 10 mg/dL (ref 6–20)
CO2: 26 mmol/L (ref 22–32)
Calcium: 8.8 mg/dL — ABNORMAL LOW (ref 8.9–10.3)
Chloride: 104 mmol/L (ref 101–111)
Creatinine, Ser: 0.87 mg/dL (ref 0.44–1.00)
GFR calc Af Amer: >60 mL/min (ref >60)
Glucose, Bld: 101 mg/dL — ABNORMAL HIGH (ref 65–99)
Potassium: 3.6 mmol/L (ref 3.5–5.1)
SODIUM: 140 mmol/L (ref 135–145)
Total Bilirubin: 0.7 mg/dL (ref 0.3–1.2)
Total Protein: 7.4 g/dL (ref 6.5–8.1)

## 2015-03-09 LAB — URINALYSIS, ROUTINE W REFLEX MICROSCOPIC
Bilirubin Urine: NEGATIVE
Glucose, UA: NEGATIVE mg/dL
Hgb urine dipstick: NEGATIVE
KETONES UR: 15 mg/dL — AB
Leukocytes, UA: NEGATIVE
Nitrite: NEGATIVE
PH: 7 (ref 5.0–8.0)
Protein, ur: NEGATIVE mg/dL
SPECIFIC GRAVITY, URINE: 1.03 (ref 1.005–1.030)
UROBILINOGEN UA: 0.2 mg/dL (ref 0.0–1.0)

## 2015-03-09 LAB — I-STAT BETA HCG BLOOD, ED (MC, WL, AP ONLY)

## 2015-03-09 MED ORDER — TETANUS-DIPHTH-ACELL PERTUSSIS 5-2.5-18.5 LF-MCG/0.5 IM SUSP
0.5000 mL | Freq: Once | INTRAMUSCULAR | Status: AC
Start: 2015-03-09 — End: 2015-03-09
  Administered 2015-03-09: 0.5 mL via INTRAMUSCULAR
  Filled 2015-03-09: qty 0.5

## 2015-03-09 MED ORDER — ONDANSETRON HCL 4 MG/2ML IJ SOLN
4.0000 mg | Freq: Once | INTRAMUSCULAR | Status: AC
  Administered 2015-03-09: 4 mg via INTRAVENOUS
  Filled 2015-03-09: qty 2

## 2015-03-09 MED ORDER — IOHEXOL 300 MG/ML  SOLN
100.0000 mL | Freq: Once | INTRAMUSCULAR | Status: AC | PRN
  Administered 2015-03-09: 100 mL via INTRAVENOUS

## 2015-03-09 MED ORDER — FENTANYL CITRATE (PF) 100 MCG/2ML IJ SOLN
100.0000 ug | INTRAMUSCULAR | Status: DC | PRN
  Administered 2015-03-09 (×2): 100 ug via INTRAVENOUS
  Filled 2015-03-09 (×2): qty 2

## 2015-03-09 MED ORDER — SODIUM CHLORIDE 0.9 % IV BOLUS (SEPSIS)
1000.0000 mL | Freq: Once | INTRAVENOUS | Status: AC
  Administered 2015-03-09: 1000 mL via INTRAVENOUS

## 2015-03-09 MED ORDER — CYCLOBENZAPRINE HCL 10 MG PO TABS: 10.0000 mg | ORAL_TABLET | Freq: Two times a day (BID) | ORAL | Status: DC | PRN

## 2015-03-09 MED ORDER — OXYCODONE-ACETAMINOPHEN 5-325 MG PO TABS: 2.0000 | ORAL_TABLET | ORAL | Status: DC | PRN
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
## 2015-03-09 NOTE — ED Notes (Signed)
Pt ambulated with no assistance. SPO2 stayed 98-100% throughout ambulation.
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
## 2015-03-09 NOTE — ED Notes (Signed)
Patient was given gram crackers and peanut butter, with Ginger ale.

## 2015-03-09 NOTE — ED Notes (Addendum)
Pt keeps adjusting c-collar and will not keep c-collar on properly. C-collar now on and aligned properly.

## 2015-03-09 NOTE — Discharge Instructions (Signed)
Take Percocet as needed for pain. Take Flexeril as needed for muscle spasm. Refer to attached documents for more information.  °
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
## 2015-03-09 NOTE — ED Notes (Signed)
Patient being transported to Xray

## 2015-03-22 ENCOUNTER — Encounter (HOSPITAL_COMMUNITY): Payer: Self-pay | Admitting: Emergency Medicine

## 2015-03-22 ENCOUNTER — Emergency Department (HOSPITAL_COMMUNITY)
Admission: EM | Admit: 2015-03-22 | Discharge: 2015-03-23 | Disposition: A | Discharge status: Home / Self Care | Payer: Self-pay | Attending: Emergency Medicine | Admitting: Emergency Medicine

## 2015-03-22 DIAGNOSIS — G8911 Acute pain due to trauma: Secondary | ICD-10-CM | POA: Insufficient documentation

## 2015-03-22 DIAGNOSIS — R0789 Other chest pain: Secondary | ICD-10-CM | POA: Insufficient documentation

## 2015-03-22 LAB — I-STAT CHEM 8, ED
BUN: 16 mg/dL (ref 6–20)
CALCIUM ION: 1.12 mmol/L (ref 1.12–1.23)
Chloride: 100 mmol/L — ABNORMAL LOW (ref 101–111)
Creatinine, Ser: 0.9 mg/dL (ref 0.44–1.00)
GLUCOSE: 91 mg/dL (ref 65–99)
HCT: 39 % (ref 36.0–46.0)
HEMOGLOBIN: 13.3 g/dL (ref 12.0–15.0)
POTASSIUM: 3.3 mmol/L — AB (ref 3.5–5.1)
SODIUM: 138 mmol/L (ref 135–145)
TCO2: 25 mmol/L (ref 0–100)

## 2015-03-22 LAB — I-STAT TROPONIN, ED: Troponin i, poc: 0 ng/mL (ref 0.00–0.08)

## 2015-03-22 LAB — I-STAT BETA HCG BLOOD, ED (MC, WL, AP ONLY): I-stat hCG, quantitative: <5 m[IU]/mL (ref <5)

## 2015-03-22 LAB — CBC
HCT: 36.5 % (ref 36.0–46.0)
Hemoglobin: 12.7 g/dL (ref 12.0–15.0)
MCH: 30.4 pg (ref 26.0–34.0)
MCHC: 34.8 g/dL (ref 30.0–36.0)
MCV: 87.3 fL (ref 78.0–100.0)
PLATELETS: 296 10*3/uL (ref 150–400)
RBC: 4.18 MIL/uL (ref 3.87–5.11)
RDW: 13 % (ref 11.5–15.5)
WBC: 7.4 10*3/uL (ref 4.0–10.5)

## 2015-03-22 MED ORDER — METHOCARBAMOL 500 MG PO TABS
1000.0000 mg | ORAL_TABLET | Freq: Once | ORAL | Status: AC
  Administered 2015-03-23: 1000 mg via ORAL
  Filled 2015-03-22: qty 2

## 2015-03-22 MED ORDER — KETOROLAC TROMETHAMINE 30 MG/ML IJ SOLN
30.0000 mg | Freq: Once | INTRAMUSCULAR | Status: DC
  Filled 2015-03-22: qty 1

## 2015-03-22 MED ORDER — GI COCKTAIL ~~LOC~~
30.0000 mL | Freq: Once | ORAL | Status: DC
Start: 2015-03-22 — End: 2015-03-23
  Filled 2015-03-22: qty 30

## 2015-03-22 NOTE — ED Notes (Signed)
Patient transported to X-ray 

## 2015-03-22 NOTE — ED Notes (Signed)
Pt. reports right upper chest pain with mild SOB onset last week , denies emesis or diaphoresis .
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
## 2015-03-23 ENCOUNTER — Emergency Department (HOSPITAL_COMMUNITY): Payer: Self-pay

## 2015-03-23 ENCOUNTER — Encounter (HOSPITAL_COMMUNITY): Payer: Self-pay | Admitting: Emergency Medicine

## 2015-03-23 LAB — BASIC METABOLIC PANEL
Anion gap: 8 (ref 5–15)
BUN: 13 mg/dL (ref 6–20)
CALCIUM: 8.7 mg/dL — AB (ref 8.9–10.3)
CHLORIDE: 100 mmol/L — AB (ref 101–111)
CO2: 28 mmol/L (ref 22–32)
CREATININE: 0.85 mg/dL (ref 0.44–1.00)
GFR calc Af Amer: >60 mL/min (ref >60)
GFR calc non Af Amer: >60 mL/min (ref >60)
Glucose, Bld: 93 mg/dL (ref 65–99)
Potassium: 3.4 mmol/L — ABNORMAL LOW (ref 3.5–5.1)
Sodium: 136 mmol/L (ref 135–145)

## 2015-03-23 LAB — BRAIN NATRIURETIC PEPTIDE: B NATRIURETIC PEPTIDE 5: 25.1 pg/mL (ref 0.0–100.0)

## 2015-03-23 LAB — D-DIMER, QUANTITATIVE (NOT AT ARMC): D DIMER QUANT: 1.08 ug{FEU}/mL — AB (ref 0.00–0.48)

## 2015-03-23 MED ORDER — METHOCARBAMOL 500 MG PO TABS: 500.0000 mg | ORAL_TABLET | Freq: Two times a day (BID) | ORAL | Status: DC

## 2015-03-23 MED ORDER — SODIUM CHLORIDE 0.9 % IV BOLUS (SEPSIS)
1000.0000 mL | Freq: Once | INTRAVENOUS | Status: AC
  Administered 2015-03-23: 1000 mL via INTRAVENOUS

## 2015-03-23 MED ORDER — MELOXICAM 7.5 MG PO TABS: 7.5000 mg | ORAL_TABLET | Freq: Every day | ORAL | Status: DC

## 2015-03-23 MED ORDER — POTASSIUM CHLORIDE CRYS ER 20 MEQ PO TBCR
40.0000 meq | EXTENDED_RELEASE_TABLET | Freq: Once | ORAL | Status: AC
  Administered 2015-03-23: 40 meq via ORAL
  Filled 2015-03-23: qty 2

## 2015-03-23 MED ORDER — IOHEXOL 350 MG/ML SOLN
100.0000 mL | Freq: Once | INTRAVENOUS | Status: AC | PRN
  Administered 2015-03-23: 100 mL via INTRAVENOUS

## 2015-03-23 NOTE — Discharge Instructions (Signed)
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
## 2017-11-13 DIAGNOSIS — N9489 Other specified conditions associated with female genital organs and menstrual cycle: Secondary | ICD-10-CM | POA: Diagnosis not present

## 2019-03-26 DIAGNOSIS — H5213 Myopia, bilateral: Secondary | ICD-10-CM | POA: Diagnosis not present

## 2019-07-08 ENCOUNTER — Other Ambulatory Visit: Payer: Self-pay

## 2019-07-08 DIAGNOSIS — Z20828 Contact with and (suspected) exposure to other viral communicable diseases: Secondary | ICD-10-CM | POA: Diagnosis not present

## 2019-07-08 DIAGNOSIS — Z20822 Contact with and (suspected) exposure to covid-19: Secondary | ICD-10-CM

## 2019-07-10 LAB — NOVEL CORONAVIRUS, NAA: SARS-CoV-2, NAA: NOT DETECTED

## 2019-07-17 ENCOUNTER — Ambulatory Visit
Admission: EM | Admit: 2019-07-17 | Discharge: 2019-07-17 | Disposition: A | Discharge status: Home / Self Care | Payer: Medicaid Other | Attending: Physician Assistant | Admitting: Physician Assistant

## 2019-07-17 DIAGNOSIS — Z3202 Encounter for pregnancy test, result negative: Secondary | ICD-10-CM | POA: Diagnosis not present

## 2019-07-17 DIAGNOSIS — R5383 Other fatigue: Secondary | ICD-10-CM

## 2019-07-17 DIAGNOSIS — J3489 Other specified disorders of nose and nasal sinuses: Secondary | ICD-10-CM

## 2019-07-17 DIAGNOSIS — R0981 Nasal congestion: Secondary | ICD-10-CM | POA: Diagnosis not present

## 2019-07-17 LAB — POCT URINE PREGNANCY: Preg Test, Ur: NEGATIVE

## 2019-07-17 MED ORDER — AZELASTINE HCL 0.1 % NA SOLN: 2.0000 | Freq: Two times a day (BID) | NASAL | 0 refills | Status: AC

## 2019-07-17 NOTE — ED Provider Notes (Signed)
[facility]    CSN: [id number] Arrival date & time: 07/17/19  1932      History   Chief Complaint Chief Complaint  Patient presents with  . Nasal Congestion    HPI Meghan Pierce is a 48 y.o. female.   48 year old female comes in for 2 day history of URI symptoms. She has had rhinorrhea, nasal congestion, sinus pressure, sore throat. States this started after she was working out outside. She had some chills and fatigue last week and had COVID testing 07/08/2019, which was negative. However, states her symptoms completely resolved prior to current symptom onset. She denies fever, chills, body aches. Denies cough, shortness of breath. Denies abdominal pain, nausea, vomiting, diarrhea. Denies loss of taste/smell.   Patient states she still feels fatigued at times with breast tenderness and would like to be evaluated for pregnancy. She denies urinary symptoms such as urinary frequency, dysuria, hematuria. LMP 06/21/2019.     History reviewed. No pertinent past medical history.  There are no active problems to display for this patient.   Past Surgical History:  Procedure Laterality Date  . c-section      OB History   No obstetric history on file.      Home Medications    Prior to Admission medications   Medication Sig Start Date End Date Taking? Authorizing Provider  azelastine (ASTELIN) 0.1 % nasal spray Place 2 sprays into both nostrils 2 (two) times daily. Use in each nostril as directed 07/17/19   Ok Edwards, PA-C    Family History No family history on file.  Social History Social History   Tobacco Use  . Smoking status: Never Smoker  . Smokeless tobacco: Current User  Substance Use Topics  . Alcohol use: No  . Drug use: No     Allergies   Tape   Review of Systems Review of Systems  Reason unable to perform ROS: See HPI as above.     Physical Exam Triage Vital Signs ED Triage Vitals  Enc Vitals Group     BP 07/17/19 1940 118/62      Pulse Rate 07/17/19 1940 94     Resp 07/17/19 1940 18     Temp 07/17/19 1940 99.6 F (37.6 C)     Temp Source 07/17/19 1940 Oral     SpO2 07/17/19 1940 96 %     Weight --      Height --      Head Circumference --      Peak Flow --      Pain Score 07/17/19 1941 0     Pain Loc --      Pain Edu? --      Excl. in [place]? --    No data found.  Updated Vital Signs BP 118/62 (BP Location: Left Arm)   Pulse 94   Temp 99.6 F (37.6 C) (Oral)   Resp 18   LMP 06/21/2019   SpO2 96%   Physical Exam Constitutional:      General: She is not in acute distress.    Appearance: Normal appearance. She is not ill-appearing, toxic-appearing or diaphoretic.  HENT:     Head: Normocephalic and atraumatic.     Right Ear: Tympanic membrane, ear canal and external ear normal. Tympanic membrane is not erythematous or bulging.     Left Ear: Tympanic membrane, ear canal and external ear normal. Tympanic membrane is not erythematous or bulging.     Nose:  Right Sinus: No maxillary sinus tenderness or frontal sinus tenderness.     Left Sinus: No maxillary sinus tenderness or frontal sinus tenderness.     Mouth/Throat:     Mouth: Mucous membranes are moist.     Pharynx: Oropharynx is clear. Uvula midline.  Neck:     Musculoskeletal: Normal range of motion and neck supple.  Cardiovascular:     Rate and Rhythm: Normal rate and regular rhythm.     Heart sounds: Normal heart sounds. No murmur. No friction rub. No gallop.   Pulmonary:     Effort: Pulmonary effort is normal. No accessory muscle usage, prolonged expiration, respiratory distress or retractions.     Comments: Lungs clear to auscultation without adventitious lung sounds. Abdominal:     General: Bowel sounds are normal.     Palpations: Abdomen is soft.     Tenderness: There is no abdominal tenderness. There is no guarding or rebound.  Skin:    General: Skin is warm and dry.  Neurological:     General: No focal deficit present.      Mental Status: She is alert and oriented to person, place, and time.     UC Treatments / Results  Labs (all labs ordered are listed, but only abnormal results are displayed) Labs Reviewed  POCT URINE PREGNANCY    EKG   Radiology No results found.  Procedures Procedures (including critical care time)  Medications Ordered in UC Medications - No data to display  Initial Impression / Assessment and Plan / UC Course  I have reviewed the triage vital signs and the nursing notes.  Pertinent labs & imaging results that were available during my care of the patient were reviewed by me and considered in my medical decision making (see chart for details).     Discussed with patient, cannot rule out Covid as her symptoms completely resolved prior to current symptom onset.  Patient currently declines Covid testing.  No alarming signs on exam, no signs of bacterial infection.  Discussed trying allergy medications as azelastine to help with symptoms. Discussed fatigue could be sign of viral infection as well. Discussed if continues with fatigue, will need follow-up with PCP for further evaluation needed.  Urine pregnancy negative.  However, LMP 06/21/2019, discussed will need to await 4 to 6-week after her last cycle for accurate testing.  Can follow up with PCP/OBGYN for further evaluation needed.  Return precautions given.  Final Clinical Impressions(s) / UC Diagnoses   Final diagnoses:  Nasal congestion  Sinus pressure  Fatigue, unspecified type    ED Prescriptions    Medication Sig Dispense Auth. Provider   azelastine (ASTELIN) 0.1 % nasal spray Place 2 sprays into both nostrils 2 (two) times daily. Use in each nostril as directed 30 mL Belinda Fisher, PA-C     PDMP not reviewed this encounter.   Belinda Fisher, PA-C 07/17/19 2018

## 2019-07-17 NOTE — ED Triage Notes (Signed)
Pt c/o sinus pressure, head congestion, sore throat after working outside yesterday. C/o feeling tired and tender breast, states thinks she may be pregnant.

## 2019-07-17 NOTE — ED Triage Notes (Signed)
Pt also c/o rt upper tooth ache x2wks

## 2019-07-17 NOTE — Discharge Instructions (Addendum)
Your pregnancy test was negative. As discussed, given within 4 weeks since your last cycle, please recheck after 4 to 6-week and her last cycle.  You can follow-up with OB/GYN for blood work if you would like.  As discussed, I cannot rule out Covid given your symptoms completely resolved prior to your current symptom onset. However, as per your preference, we will treat for possible allergy symptoms first.  At this time, no signs of bacterial infection.  Start azelastine nasal spray as directed.  Continue to monitor symptoms.  If symptoms not improving, develop worsening cold symptoms such as cough, fever, shortness of breath, will need to be retested for Covid.  If symptoms resolve, but continues to feel fatigued, please follow-up with your primary care for further evaluation and management needed.

## 2019-07-20 ENCOUNTER — Encounter (HOSPITAL_COMMUNITY): Payer: Self-pay | Admitting: *Deleted

## 2019-07-20 ENCOUNTER — Emergency Department (HOSPITAL_COMMUNITY)
Admission: EM | Admit: 2019-07-20 | Discharge: 2019-07-20 | Disposition: A | Discharge status: Home / Self Care | Payer: Medicaid Other | Attending: Emergency Medicine | Admitting: Emergency Medicine

## 2019-07-20 ENCOUNTER — Other Ambulatory Visit: Payer: Self-pay

## 2019-07-20 DIAGNOSIS — J029 Acute pharyngitis, unspecified: Secondary | ICD-10-CM | POA: Diagnosis not present

## 2019-07-20 DIAGNOSIS — F17228 Nicotine dependence, chewing tobacco, with other nicotine-induced disorders: Secondary | ICD-10-CM | POA: Diagnosis not present

## 2019-07-20 DIAGNOSIS — R509 Fever, unspecified: Secondary | ICD-10-CM | POA: Diagnosis present

## 2019-07-20 DIAGNOSIS — R519 Headache, unspecified: Secondary | ICD-10-CM | POA: Diagnosis not present

## 2019-07-20 DIAGNOSIS — R0981 Nasal congestion: Secondary | ICD-10-CM | POA: Insufficient documentation

## 2019-07-20 LAB — GROUP A STREP BY PCR: Group A Strep by PCR: NOT DETECTED

## 2019-07-20 MED ORDER — ACETAMINOPHEN 325 MG PO TABS
650.0000 mg | ORAL_TABLET | Freq: Once | ORAL | Status: AC
  Administered 2019-07-20: 22:00:00 650 mg via ORAL
  Filled 2019-07-20: qty 2

## 2019-07-20 MED ORDER — DEXAMETHASONE SODIUM PHOSPHATE 10 MG/ML IJ SOLN
10.0000 mg | Freq: Once | INTRAMUSCULAR | Status: AC
  Administered 2019-07-20: 10 mg via INTRAMUSCULAR
  Filled 2019-07-20: qty 1

## 2019-07-20 MED ORDER — PENICILLIN G BENZATHINE 1200000 UNIT/2ML IM SUSP
1.2000 10*6.[IU] | Freq: Once | INTRAMUSCULAR | Status: AC
  Administered 2019-07-20: 22:00:00 1.2 10*6.[IU] via INTRAMUSCULAR
  Filled 2019-07-20: qty 2

## 2019-07-20 NOTE — ED Notes (Signed)
Pt able to tolerate fluids well, denies N/V

## 2019-07-20 NOTE — ED Triage Notes (Signed)
Pt c/o generalized bodyaches, fever, and sore throat x 3 weeks. Reports symptoms subsided then returned. Was tested for Covid at that time and was negative. Has not been around covid + people that she's aware of but does outside zumba

## 2019-07-20 NOTE — Discharge Instructions (Addendum)
Make sure to get adequate fluids.  You are given antibiotics in the emergency department.  Alternate Tylenol and ibuprofen as needed for pain.  Do not take more than 4000 mg Tylenol, more than 2400 mg ibuprofen daily.  Follow-up in urgent care in 2-3 days if symptoms unresolved

## 2019-07-20 NOTE — ED Notes (Signed)
Last took tylenol around 4pm

## 2019-07-20 NOTE — ED Provider Notes (Signed)
[facility] Provider Note   CSN: [id number] Arrival date & time: 07/20/19  1841   History   Chief Complaint Chief Complaint  Patient presents with  . Fever   HPI Meghan Pierce is a 48 y.o. female with patient past medical history who presents for evaluation of sore throat and fever.  Patient was seen at urgent care 3 days ago.  Patient at that time stated she had rhinorrhea, congestion, sore throat.  She did not have strep test at that time.  Her symptoms had started after she was working outside.  Patient states 2 weeks ago she did have some chills and fatigue.  She had a Covid test at that time which was negative.  Her symptoms had completely resolved before they recurred the last 3 days.  States yesterday she felt she had a fever however is not taking her temperature at home.  Has been taking Tylenol and ibuprofen intermittently for her body aches and pains.  She denies any current nasal congestion, rhinorrhea, sudden onset than her cough headache, lateral weakness, drooling, dysphagia, trismus, chest pain, shortness of breath, abdominal pain, diarrhea, dysuria.  She denies any recent Covid exposures.  Testing patient had done at urgent care was a urine pregnancy test which was negative.  Did not want a repeat Covid testing at that time.  Denies additional aggravating or alleviating factors.  Patient states she has had a headache located to her frontal temporal region.  She denies sudden onset thunderclap headache, not a weakness, neck pain, neck stiffness.  History obtained from patient and past medical records.  No interpreter is used.     HPI  History reviewed. No pertinent past medical history.  There are no active problems to display for this patient.   Past Surgical History:  Procedure Laterality Date  . c-section       OB History   No obstetric history on file.      Home Medications    Prior to Admission medications    Medication Sig Start Date End Date Taking? Authorizing Provider  azelastine (ASTELIN) 0.1 % nasal spray Place 2 sprays into both nostrils 2 (two) times daily. Use in each nostril as directed 07/17/19   Ok Edwards, PA-C    Family History No family history on file.  Social History Social History   Tobacco Use  . Smoking status: Never Smoker  . Smokeless tobacco: Current User  Substance Use Topics  . Alcohol use: No  . Drug use: No     Allergies   Tape   Review of Systems Review of Systems  Constitutional: Positive for chills and fever.  HENT: Positive for sore throat. Negative for congestion, ear discharge, ear pain, facial swelling, nosebleeds, postnasal drip, rhinorrhea, sinus pressure, sinus pain, sneezing, tinnitus, trouble swallowing and voice change.   Respiratory: Negative.   Cardiovascular: Negative.   Gastrointestinal: Negative.   Genitourinary: Negative.   Musculoskeletal: Negative.   Skin: Negative.   Neurological: Positive for headaches. Negative for dizziness, tremors, seizures, syncope, facial asymmetry, speech difficulty, weakness, light-headedness and numbness.  All other systems reviewed and are negative.    Physical Exam Updated Vital Signs BP 123/87   Pulse 95   Temp 99.3 F (37.4 C) (Oral)   Resp 19   Ht 5\' 5"  (1.651 m)   Wt 66.2 kg   LMP 07/20/2019   SpO2 100%   BMI 24.30 kg/m   Physical Exam Vitals signs and nursing note reviewed.  Constitutional:      General: She is not in acute distress.    Appearance: She is not ill-appearing, toxic-appearing or diaphoretic.  HENT:     Head: Normocephalic and atraumatic.     Jaw: There is normal jaw occlusion.     Right Ear: Tympanic membrane, ear canal and external ear normal. There is no impacted cerumen. No hemotympanum. Tympanic membrane is not injected, scarred, perforated, erythematous, retracted or bulging.     Left Ear: Tympanic membrane, ear canal and external ear normal. There is no  impacted cerumen. No hemotympanum. Tympanic membrane is not injected, scarred, perforated, erythematous, retracted or bulging.     Ears:     Comments: No Mastoid tenderness.    Nose:     Comments: Clear rhinorrhea and congestion to bilateral nares.  No sinus tenderness.    Mouth/Throat:     Lips: Pink.     Mouth: Mucous membranes are moist.     Palate: No mass and lesions.     Pharynx: Uvula midline. Posterior oropharyngeal erythema present. No pharyngeal swelling, oropharyngeal exudate or uvula swelling.     Tonsils: Tonsillar exudate present. No tonsillar abscesses. 2+ on the right. 2+ on the left.     Comments: Posterior oropharynx erythematous.  Mucous membranes moist.  Tonsils 2+ bilaterally with exudate.  Uvula midline without deviation.  No evidence of PTA or RPA.  No drooling, dysphasia or trismus.  Phonation normal.  Lingual area soft. Eyes:     Extraocular Movements: Extraocular movements intact.     Conjunctiva/sclera: Conjunctivae normal.     Comments: No horizontal, vertical or rotational nystagmus   Neck:     Musculoskeletal: Full passive range of motion without pain and normal range of motion.     Vascular: Normal carotid pulses. No JVD.     Trachea: Trachea and phonation normal.     Meningeal: Brudzinski's sign and Kernig's sign absent.     Comments: No Neck stiffness or neck rigidity.  No meningismus.   Full active and passive ROM without pain No midline or paraspinal tenderness No nuchal rigidity or meningeal signs  Cardiovascular:     Comments: No murmurs rubs or gallops. Pulmonary:     Comments: Clear to auscultation bilaterally without wheeze, rhonchi or rales.  No accessory muscle usage.  Able speak in full sentences. Abdominal:     Comments: Soft, nontender without rebound or guarding.  No CVA tenderness.  Musculoskeletal:     Comments: Moves all 4 extremities without difficulty.  Lower extremities without edema, erythema or warmth.  Lymphadenopathy:      Cervical: Cervical adenopathy present.  Skin:    Comments: Brisk capillary refill.  No rashes or lesions.  Neurological:     Mental Status: She is alert.     Comments: Mental Status:  Alert, oriented, thought content appropriate. Speech fluent without evidence of aphasia. Able to follow 2 step commands without difficulty.  Cranial Nerves:  II:  Peripheral visual fields grossly normal, pupils equal, round, reactive to light III,IV, VI: ptosis not present, extra-ocular motions intact bilaterally  V,VII: smile symmetric, facial light touch sensation equal VIII: hearing grossly normal bilaterally  IX,X: midline uvula rise  XI: bilateral shoulder shrug equal and strong XII: midline tongue extension  Motor:  5/5 in upper and lower extremities bilaterally including strong and equal grip strength and dorsiflexion/plantar flexion Sensory: Pinprick and light touch normal in all extremities.  Deep Tendon Reflexes: 2+ and symmetric  Cerebellar: normal finger-to-nose with bilateral upper  extremities Gait: normal gait and balance CV: distal pulses palpable throughout        Neck: Normal range of motion. Neck supple.     ED Treatments / Results  Labs (all labs ordered are listed, but only abnormal results are displayed) Labs Reviewed  GROUP A STREP BY PCR    EKG None  Radiology No results found.  Procedures Procedures (including critical care time)  Medications Ordered in ED Medications  acetaminophen (TYLENOL) tablet 650 mg (650 mg Oral Given 07/20/19 2144)  dexamethasone (DECADRON) injection 10 mg (10 mg Intramuscular Given 07/20/19 2146)  penicillin g benzathine (BICILLIN LA) 1200000 UNIT/2ML injection 1.2 Million Units (1.2 Million Units Intramuscular Given 07/20/19 2146)   Initial Impression / Assessment and Plan / ED Course  I have reviewed the triage vital signs and the nursing notes.  Pertinent labs & imaging results that were available during my care of the patient  were reviewed by me and considered in my medical decision making (see chart for details).  48 year old female appears otherwise well presents for evaluation of fever, sore throat.  She is febrile to 102.4 here in the emergency department.  She is also had a headache however denies sudden onset thunderclap headache, any neurologic symptoms.  She has a nonfocal neurologic exam without deficits.  She has no neck stiffness or neck rigidity. She does have very erythematous posterior oropharynx.  Her tonsils are 2+ bilaterally with exudate.  Her uvula is midline without deviation.  No drooling, dysphagia or trismus.  She is tolerating p.o. intake in ED without difficulty.  Heart and lungs clear.  Abdomen soft, nontender without rebound or guarding.  Patient without any evidence of sepsis or SIRS.  She has no tachycardia, tachypnea or hypoxia.  She has had body aches and pains as well as well as congestion and rhinorrhea however this was 2 days ago and the symptoms have resolved.  She does not want Covid testing at this time.   Temp recheck prior to Tylenol with improved to 99.3 without antipyretics.  Likely patient has strep pharyngitis given fever, tonsillar edema and exudate.  She is tolerating p.o. intake.  She has no evidence of PTA or RPA.  She also has cervical lymphadenopathy.  She has no neck pain or neck neck stiffness.  She has no meningismus.  I have low suspicion for meningitis as cause of her fever.  Heart and lungs clear. She does not want repeat COVID testing at this time.  Pt febrile with tonsillar exudate, cervical lymphadenopathy, & dysphagia; diagnosis of bacterial pharyngitis. Treated in the ED with steroids, and PCN IM.  Pt appears mildly dehydrated, discussed importance of water rehydration.  No trismus or uvula deviation. Specific return precautions discussed. Pt able to drink water in ED without difficulty with intact air way. Recommended PCP follow up.  The patient has been appropriately  medically screened and/or stabilized in the ED. I have low suspicion for any other emergent medical condition which would require further screening, evaluation or treatment in the ED or require inpatient management.       ALIANNA WURSTER was evaluated in Emergency Department on 07/20/2019 for the symptoms described in the history of present illness. She was evaluated in the context of the global COVID-19 pandemic, which necessitated consideration that the patient might be at risk for infection with the SARS-CoV-2 virus that causes COVID-19. Institutional protocols and algorithms that pertain to the evaluation of patients at risk for COVID-19 are in a state of  rapid change based on information released by regulatory bodies including the CDC and federal and state organizations. These policies and algorithms were followed during the patient's care in the ED. Final Clinical Impressions(s) / ED Diagnoses   Final diagnoses:  Pharyngitis, unspecified etiology    ED Discharge Orders    None       Jaycee Mckellips A, PA-C 07/20/19 2200    Raeford RazorKohut, Stephen, MD 07/21/19 1649

## 2019-07-23 DIAGNOSIS — K029 Dental caries, unspecified: Secondary | ICD-10-CM | POA: Diagnosis not present

## 2019-07-30 DIAGNOSIS — K029 Dental caries, unspecified: Secondary | ICD-10-CM | POA: Diagnosis not present

## 2020-02-23 DIAGNOSIS — N760 Acute vaginitis: Secondary | ICD-10-CM | POA: Diagnosis not present

## 2020-02-23 DIAGNOSIS — Z113 Encounter for screening for infections with a predominantly sexual mode of transmission: Secondary | ICD-10-CM | POA: Diagnosis not present

## 2020-03-08 DIAGNOSIS — A549 Gonococcal infection, unspecified: Secondary | ICD-10-CM | POA: Diagnosis not present

## 2020-03-08 DIAGNOSIS — A599 Trichomoniasis, unspecified: Secondary | ICD-10-CM | POA: Diagnosis not present
